# Patient Record
Sex: Male | Born: 1950 | Race: White | Hispanic: No | Marital: Married | Smoking: Never smoker
Health system: Southern US, Community
[De-identification: ages and names within clinical notes are randomized; demographics above are authoritative.]

## PROBLEM LIST (undated history)

## (undated) DIAGNOSIS — K439 Ventral hernia without obstruction or gangrene: Secondary | ICD-10-CM

## (undated) DIAGNOSIS — K649 Unspecified hemorrhoids: Secondary | ICD-10-CM

## (undated) DIAGNOSIS — R519 Headache, unspecified: Secondary | ICD-10-CM

## (undated) DIAGNOSIS — K5900 Constipation, unspecified: Secondary | ICD-10-CM

## (undated) DIAGNOSIS — K4090 Unilateral inguinal hernia, without obstruction or gangrene, not specified as recurrent: Secondary | ICD-10-CM

## (undated) HISTORY — PX: HERNIA REPAIR: SHX51

## (undated) HISTORY — PX: COLONOSCOPY: SHX174

---

## 2009-07-07 ENCOUNTER — Ambulatory Visit: Payer: Self-pay | Admitting: Gastroenterology

## 2011-11-08 ENCOUNTER — Emergency Department: Payer: Self-pay | Admitting: *Deleted

## 2011-11-08 LAB — BASIC METABOLIC PANEL
BUN: 13 mg/dL (ref 7–18)
EGFR (Non-African Amer.): >60
Glucose: 118 mg/dL — ABNORMAL HIGH (ref 65–99)
Osmolality: 282 (ref 275–301)
Potassium: 4 mmol/L (ref 3.5–5.1)

## 2011-11-08 LAB — CK TOTAL AND CKMB (NOT AT ARMC)
CK, Total: 97 U/L (ref 35–232)
CK, Total: 97 U/L (ref 35–232)

## 2011-11-08 LAB — CBC
HCT: 44.5 % (ref 40.0–52.0)
HGB: 15.7 g/dL (ref 13.0–18.0)
MCH: 33.7 pg (ref 26.0–34.0)
Platelet: 236 10*3/uL (ref 150–440)
RBC: 4.67 10*6/uL (ref 4.40–5.90)

## 2013-03-05 ENCOUNTER — Emergency Department (HOSPITAL_COMMUNITY)
Admission: EM | Admit: 2013-03-05 | Discharge: 2013-03-05 | Disposition: A | Discharge status: Home / Self Care | Payer: Worker's Compensation | Attending: Emergency Medicine | Admitting: Emergency Medicine

## 2013-03-05 ENCOUNTER — Encounter (HOSPITAL_COMMUNITY): Payer: Self-pay

## 2013-03-05 ENCOUNTER — Emergency Department (HOSPITAL_COMMUNITY): Payer: Worker's Compensation

## 2013-03-05 DIAGNOSIS — S0993XA Unspecified injury of face, initial encounter: Secondary | ICD-10-CM | POA: Insufficient documentation

## 2013-03-05 DIAGNOSIS — S5002XA Contusion of left elbow, initial encounter: Secondary | ICD-10-CM

## 2013-03-05 DIAGNOSIS — Y990 Civilian activity done for income or pay: Secondary | ICD-10-CM | POA: Insufficient documentation

## 2013-03-05 DIAGNOSIS — W010XXA Fall on same level from slipping, tripping and stumbling without subsequent striking against object, initial encounter: Secondary | ICD-10-CM | POA: Insufficient documentation

## 2013-03-05 DIAGNOSIS — S5000XA Contusion of unspecified elbow, initial encounter: Secondary | ICD-10-CM | POA: Insufficient documentation

## 2013-03-05 DIAGNOSIS — S6390XA Sprain of unspecified part of unspecified wrist and hand, initial encounter: Secondary | ICD-10-CM | POA: Insufficient documentation

## 2013-03-05 DIAGNOSIS — W19XXXA Unspecified fall, initial encounter: Secondary | ICD-10-CM

## 2013-03-05 DIAGNOSIS — M542 Cervicalgia: Secondary | ICD-10-CM

## 2013-03-05 DIAGNOSIS — Y9289 Other specified places as the place of occurrence of the external cause: Secondary | ICD-10-CM | POA: Insufficient documentation

## 2013-03-05 DIAGNOSIS — S63602A Unspecified sprain of left thumb, initial encounter: Secondary | ICD-10-CM

## 2013-03-05 DIAGNOSIS — Y9389 Activity, other specified: Secondary | ICD-10-CM | POA: Insufficient documentation

## 2013-03-05 MED ORDER — IBUPROFEN 800 MG PO TABS
800.0000 mg | ORAL_TABLET | Freq: Once | ORAL | Status: AC
  Administered 2013-03-05: 800 mg via ORAL
  Filled 2013-03-05: qty 1

## 2013-03-05 MED ORDER — METHOCARBAMOL 500 MG PO TABS: ORAL_TABLET | ORAL | Status: DC

## 2013-03-05 MED ORDER — NAPROXEN 500 MG PO TABS: 500.0000 mg | ORAL_TABLET | Freq: Two times a day (BID) | ORAL | Status: DC

## 2013-03-05 NOTE — ED Notes (Signed)
Pt alert & oriented x4, stable gait. Patient given discharge instructions, paperwork & prescription(s). Patient  instructed to stop at the registration desk to finish any additional paperwork. Patient verbalized understanding. Pt left department w/ no further questions. 

## 2013-03-05 NOTE — ED Provider Notes (Signed)
CSN: [id number]     Arrival date & time 03/05/13  1128 History   This chart was scribed for Ward Givens, MD by Quintella Reichert, ED scribe.  This patient was seen in room APA15/APA15 and the patient's care was started at 1:25 PM.  Chief Complaint  Patient presents with  . Fall    The history is provided by the patient. No language interpreter was used.    HPI Comments: Carl Gonzales is a 62 y.o. male with no chronic medical conditions who presents to the Emergency Department complaining of a fall that occurred 3 1/2 hours ago (10:15 this am).  Pt reports that he was at work, and a "line blew off" spilling oil on the floor and when he went to fix the line he slipped in oil and fell backward with impact to the left side of his back, left elbow, left hip and left side of his neck and head.  He was wearing a cap helmet that protected the back of his head.  He denies LOC.  Presently he complains of constant moderate pain to the  left wrist and thumb, left elbow, and the left side of his neck.  He also notes some intermittent "tingling" to the left side of his back.  He denies nausea, blurred vision, or numbness or tingling in fingers.  Pt denies regular medication usage.  He does not smoke or drink.  PCP is Dr. Robb Matar at [facility]   History reviewed. No pertinent past medical history.   History reviewed. No pertinent past surgical history.   No family history on file.   History  Substance Use Topics  . Smoking status: Never Smoker   . Smokeless tobacco: Not on file  . Alcohol Use: No   employed  Review of Systems  HENT: Positive for neck pain.   Eyes: Negative for visual disturbance.  Gastrointestinal: Negative for nausea.  Musculoskeletal: Positive for arthralgias.  Neurological: Negative for numbness.  All other systems reviewed and are negative.     Allergies  Review of patient's allergies indicates no known allergies.  Home Medications  No current outpatient prescriptions  on file.  BP 140/107  Pulse 85  Temp(Src) 98.3 F (36.8 C) (Oral)  Resp 20  Ht 5\' 7"  (1.702 m)  Wt 170 lb (77.111 kg)  BMI 26.62 kg/m2  SpO2 99%  Vital signs normal    Physical Exam  Nursing note and vitals reviewed. Constitutional: He is oriented to person, place, and time. He appears well-developed and well-nourished.  Non-toxic appearance. He does not appear ill. No distress.  HENT:  Head: Normocephalic and atraumatic.  Right Ear: External ear normal.  Left Ear: External ear normal.  Nose: Nose normal. No mucosal edema or rhinorrhea.  Mouth/Throat: Oropharynx is clear and moist and mucous membranes are normal. No dental abscesses or edematous.  Head nontender  Eyes: Conjunctivae and EOM are normal. Pupils are equal, round, and reactive to light.  Neck: Normal range of motion and full passive range of motion without pain. Neck supple.  Cardiovascular: Normal rate, regular rhythm and normal heart sounds.  Exam reveals no gallop and no friction rub.   No murmur heard. Pulmonary/Chest: Effort normal and breath sounds normal. No respiratory distress. He has no wheezes. He has no rhonchi. He has no rales. He exhibits no tenderness and no crepitus.  Abdominal: Soft. Normal appearance and bowel sounds are normal. He exhibits no distension. There is no tenderness. There is no rebound and no guarding.  Musculoskeletal: Normal range of motion. He exhibits no edema.       Left shoulder: He exhibits tenderness.       Left elbow: He exhibits no effusion.       Thoracic back: He exhibits no bony tenderness.       Lumbar back: He exhibits no bony tenderness.       Arms:      Hands: Nontender thoracic and lumbar spine Mild pain to the left shoulder on ROM.  Feels tightness posteriorly with ROM. No joint effusion to left elbow. Good ROM.  Pain in the radial aspect of left wrist on supination, no pain with dorsiflexion and extension Tender over the MCP of the left thumb without deformity,  abrasion.  Snuff box nontender  Neurological: He is alert and oriented to person, place, and time. He has normal strength. No cranial nerve deficit.  Skin: Skin is warm, dry and intact. Bruising noted. No rash noted. No erythema. No pallor.  Small dime-sized yellow bruise to left lateral chest  Psychiatric: He has a normal mood and affect. His speech is normal and behavior is normal. His mood appears not anxious.    ED Course  Procedures (including critical care time)  Medications  ibuprofen (ADVIL,MOTRIN) tablet 800 mg (800 mg Oral Given 03/05/13 1440)     DIAGNOSTIC STUDIES: Oxygen Saturation is 99% on room air, normal by my interpretation.    COORDINATION OF CARE: 1:32 PM: Discussed treatment plan which includes ibuprofen and imaging.  Pt expressed understanding and agreed to plan.  Pt given results of his xrays. He was placed in a velcro thumb spica splint.   MDM PT had mechanical fall from oil on the floor. Has pain in his left thumb with negative xrays, most likely has a sprain, but occult fracture possible. Placed in splint and ortho referral.    1. Fall, initial encounter   2. Contusion, elbow, left, initial encounter   3. Neck pain on left side   4. Sprain of thumb, left, initial encounter    New Prescriptions   METHOCARBAMOL (ROBAXIN) 500 MG TABLET    Take 1 or 2 po Q 6hrs for muscle soreness   NAPROXEN (NAPROSYN) 500 MG TABLET    Take 1 tablet (500 mg total) by mouth 2 (two) times daily.    Plan discharge   Devoria Albe, MD, FACEP   I personally performed the services described in this documentation, which was scribed in my presence. The recorded information has been reviewed and considered.  Devoria Albe, MD, Armando Gang    Ward Givens, MD 03/05/13 [phone]

## 2013-03-05 NOTE — ED Notes (Signed)
Pt escorted to lab for workers comp drug screen

## 2013-03-05 NOTE — ED Notes (Signed)
MD at bedside. 

## 2013-03-05 NOTE — ED Notes (Signed)
Pt reports fell in oil at work.  C/O pain to left side of neck, left side of back, and left hip.  Denies any LOC.  Reports hit his head but was wearing a bump cap.

## 2013-06-25 ENCOUNTER — Emergency Department: Payer: Self-pay | Admitting: Emergency Medicine

## 2013-06-25 LAB — BASIC METABOLIC PANEL
ANION GAP: 2 — AB (ref 7–16)
BUN: 11 mg/dL (ref 7–18)
CALCIUM: 9 mg/dL (ref 8.5–10.1)
Chloride: 105 mmol/L (ref 98–107)
Co2: 30 mmol/L (ref 21–32)
Creatinine: 0.87 mg/dL (ref 0.60–1.30)
EGFR (African American): >60
Glucose: 93 mg/dL (ref 65–99)
Osmolality: 273 (ref 275–301)
POTASSIUM: 3.8 mmol/L (ref 3.5–5.1)
SODIUM: 137 mmol/L (ref 136–145)

## 2013-06-25 LAB — TROPONIN I: Troponin-I: <0.02 ng/mL

## 2013-06-25 LAB — CBC
HCT: 45 % (ref 40.0–52.0)
HGB: 15.5 g/dL (ref 13.0–18.0)
MCH: 32.8 pg (ref 26.0–34.0)
MCHC: 34.3 g/dL (ref 32.0–36.0)
MCV: 96 fL (ref 80–100)
PLATELETS: 271 10*3/uL (ref 150–440)
RBC: 4.71 10*6/uL (ref 4.40–5.90)
RDW: 12.3 % (ref 11.5–14.5)
WBC: 6.4 10*3/uL (ref 3.8–10.6)

## 2013-06-25 LAB — PRO B NATRIURETIC PEPTIDE: B-Type Natriuretic Peptide: 29 pg/mL (ref 0–125)

## 2014-09-02 ENCOUNTER — Ambulatory Visit: Payer: Self-pay | Admitting: Gastroenterology

## 2019-04-19 ENCOUNTER — Other Ambulatory Visit: Payer: Self-pay

## 2019-04-19 DIAGNOSIS — Z20822 Contact with and (suspected) exposure to covid-19: Secondary | ICD-10-CM

## 2019-04-20 LAB — NOVEL CORONAVIRUS, NAA: SARS-CoV-2, NAA: NOT DETECTED

## 2020-01-10 ENCOUNTER — Encounter: Payer: Self-pay | Admitting: *Deleted

## 2020-01-10 ENCOUNTER — Other Ambulatory Visit
Admission: RE | Admit: 2020-01-10 | Discharge: 2020-01-10 | Disposition: A | Discharge status: Home / Self Care | Payer: Medicare Other | Source: Ambulatory Visit | Attending: Gastroenterology | Admitting: Gastroenterology

## 2020-01-10 ENCOUNTER — Other Ambulatory Visit: Payer: Self-pay

## 2020-01-10 DIAGNOSIS — Z01812 Encounter for preprocedural laboratory examination: Secondary | ICD-10-CM | POA: Diagnosis present

## 2020-01-10 DIAGNOSIS — Z20822 Contact with and (suspected) exposure to covid-19: Secondary | ICD-10-CM | POA: Insufficient documentation

## 2020-01-10 LAB — SARS CORONAVIRUS 2 (TAT 6-24 HRS): SARS Coronavirus 2: NEGATIVE

## 2020-01-11 ENCOUNTER — Encounter
Admission: RE | Disposition: A | Discharge status: Home / Self Care | Payer: Self-pay | Source: Home / Self Care | Attending: Gastroenterology

## 2020-01-11 ENCOUNTER — Other Ambulatory Visit: Payer: Self-pay

## 2020-01-11 ENCOUNTER — Ambulatory Visit: Payer: Medicare Other | Admitting: Certified Registered"

## 2020-01-11 ENCOUNTER — Encounter: Payer: Self-pay | Admitting: *Deleted

## 2020-01-11 ENCOUNTER — Ambulatory Visit
Admission: RE | Admit: 2020-01-11 | Discharge: 2020-01-11 | Disposition: A | Discharge status: Home / Self Care | Payer: Medicare Other | Attending: Gastroenterology | Admitting: Gastroenterology

## 2020-01-11 DIAGNOSIS — K648 Other hemorrhoids: Secondary | ICD-10-CM | POA: Insufficient documentation

## 2020-01-11 DIAGNOSIS — Z791 Long term (current) use of non-steroidal anti-inflammatories (NSAID): Secondary | ICD-10-CM | POA: Insufficient documentation

## 2020-01-11 DIAGNOSIS — Z1211 Encounter for screening for malignant neoplasm of colon: Secondary | ICD-10-CM | POA: Insufficient documentation

## 2020-01-11 DIAGNOSIS — Z8371 Family history of colonic polyps: Secondary | ICD-10-CM | POA: Insufficient documentation

## 2020-01-11 DIAGNOSIS — K5730 Diverticulosis of large intestine without perforation or abscess without bleeding: Secondary | ICD-10-CM | POA: Diagnosis not present

## 2020-01-11 HISTORY — DX: Constipation, unspecified: K59.00

## 2020-01-11 HISTORY — DX: Headache, unspecified: R51.9

## 2020-01-11 HISTORY — DX: Ventral hernia without obstruction or gangrene: K43.9

## 2020-01-11 HISTORY — PX: COLONOSCOPY WITH PROPOFOL: SHX5780

## 2020-01-11 HISTORY — DX: Unilateral inguinal hernia, without obstruction or gangrene, not specified as recurrent: K40.90

## 2020-01-11 HISTORY — DX: Unspecified hemorrhoids: K64.9

## 2020-01-11 SURGERY — COLONOSCOPY WITH PROPOFOL
Anesthesia: General

## 2020-01-11 MED ORDER — PROPOFOL 500 MG/50ML IV EMUL
INTRAVENOUS | Status: DC | PRN
  Administered 2020-01-11: 130 ug/kg/min via INTRAVENOUS

## 2020-01-11 MED ORDER — SODIUM CHLORIDE 0.9 % IV SOLN
INTRAVENOUS | Status: DC
  Administered 2020-01-11: 08:00:00 1000 mL via INTRAVENOUS

## 2020-01-11 MED ORDER — PROPOFOL 500 MG/50ML IV EMUL
INTRAVENOUS | Status: AC
  Filled 2020-01-11: qty 50

## 2020-01-11 NOTE — Anesthesia Postprocedure Evaluation (Signed)
Anesthesia Post Note  Patient: Carl Gonzales  Procedure(s) Performed: COLONOSCOPY WITH PROPOFOL (N/A )  Patient location during evaluation: PACU Anesthesia Type: General Level of consciousness: awake and alert Pain management: pain level controlled Vital Signs Assessment: post-procedure vital signs reviewed and stable Respiratory status: spontaneous breathing, nonlabored ventilation and respiratory function stable Cardiovascular status: blood pressure returned to baseline and stable Postop Assessment: no apparent nausea or vomiting Anesthetic complications: no   No complications documented.   Last Vitals:  Vitals:   01/11/20 0920 01/11/20 0921  BP: 137/87 133/87  Pulse: 67 70  Resp: 18 14  Temp:    SpO2: 100% 100%    Last Pain:  Vitals:   01/11/20 0900  TempSrc: Tympanic  PainSc:                  Karleen Hampshire

## 2020-01-11 NOTE — Interval H&P Note (Signed)
History and Physical Interval Note:  01/11/2020 8:12 AM  Carl Gonzales  has presented today for surgery, with the diagnosis of FAM HX COLON POLYPS.  The various methods of treatment have been discussed with the patient and family. After consideration of risks, benefits and other options for treatment, the patient has consented to  Procedure(s): COLONOSCOPY WITH PROPOFOL (N/A) as a surgical intervention.  The patient's history has been reviewed, patient examined, no change in status, stable for surgery.  I have reviewed the patient's chart and labs.  Questions were answered to the patient's satisfaction.     Regis Bill  Ok to proceed with colonoscopy

## 2020-01-11 NOTE — Op Note (Signed)
[facility] Patient Name: Carl Gonzales Procedure Date: 01/11/2020 8:33 AM MRN: [id number] Account #: [id number] Date of Birth: 22-Apr-1951 Admit Type: Outpatient Age: 69 Room: [facility] ENDO ROOM 3 Gender: Male Note Status: Finalized Procedure:             Colonoscopy Indications:           Colon cancer screening in patient at increased risk:                         Family history of 1st-degree relative with colon                         polyps before age 39 years Providers:             Andrey Farmer MD, MD Referring MD:          Youlanda Roys. Lovie Macadamia, MD (Referring MD) Medicines:             Monitored Anesthesia Care Complications:         No immediate complications. Procedure:             Pre-Anesthesia Assessment:                        - Prior to the procedure, a History and Physical was                         performed, and patient medications and allergies were                         reviewed. The patient is competent. The risks and                         benefits of the procedure and the sedation options and                         risks were discussed with the patient. All questions                         were answered and informed consent was obtained.                         Patient identification and proposed procedure were                         verified by the physician, the nurse, the anesthetist                         and the technician in the endoscopy suite. Mental                         Status Examination: alert and oriented. Airway                         Examination: normal oropharyngeal airway and neck                         mobility. Respiratory Examination: clear to  auscultation. CV Examination: normal. Prophylactic                         Antibiotics: The patient does not require prophylactic                         antibiotics. Prior Anticoagulants: The patient has                         taken no  previous anticoagulant or antiplatelet                         agents. ASA Grade Assessment: II - A patient with mild                         systemic disease. After reviewing the risks and                         benefits, the patient was deemed in satisfactory                         condition to undergo the procedure. The anesthesia                         plan was to use monitored anesthesia care (MAC).                         Immediately prior to administration of medications,                         the patient was re-assessed for adequacy to receive                         sedatives. The heart rate, respiratory rate, oxygen                         saturations, blood pressure, adequacy of pulmonary                         ventilation, and response to care were monitored                         throughout the procedure. The physical status of the                         patient was re-assessed after the procedure.                        After obtaining informed consent, the colonoscope was                         passed under direct vision. Throughout the procedure,                         the patient's blood pressure, pulse, and oxygen                         saturations were monitored continuously. The  Colonoscope was introduced through the anus and                         advanced to the the terminal ileum. The colonoscopy                         was performed without difficulty. The patient                         tolerated the procedure well. The quality of the bowel                         preparation was good. Findings:      The perianal and digital rectal examinations were normal.      The terminal ileum appeared normal.      A single small-mouthed diverticulum was found in the ascending colon.      Multiple small-mouthed diverticula were found in the sigmoid colon.      Non-bleeding internal hemorrhoids were found during retroflexion. The        hemorrhoids were small.      The exam was otherwise without abnormality on direct and retroflexion       views. Impression:            - The examined portion of the ileum was normal.                        - Diverticulosis in the ascending colon.                        - Diverticulosis in the sigmoid colon.                        - Non-bleeding internal hemorrhoids.                        - The examination was otherwise normal on direct and                         retroflexion views.                        - No specimens collected. Recommendation:        - Discharge patient to home.                        - Resume previous diet.                        - Continue present medications.                        - Repeat colonoscopy in 5 years for screening purposes.                        - Return to referring physician as previously                         scheduled. Procedure Code(s):     --- Professional ---  G0105, Colorectal cancer screening; colonoscopy on                         individual at high risk Diagnosis Code(s):     --- Professional ---                        Z83.71, Family history of colonic polyps                        K64.8, Other hemorrhoids                        K57.30, Diverticulosis of large intestine without                         perforation or abscess without bleeding CPT copyright 2019 [employer]. All rights reserved. The codes documented in this report are preliminary and upon coder review may  be revised to meet current compliance requirements. Andrey Farmer, MD Andrey Farmer MD, MD 01/11/2020 9:13:58 AM Number of Addenda: 0 Note Initiated On: 01/11/2020 8:33 AM Scope Withdrawal Time: 0 hours 10 minutes 59 seconds  Total Procedure Duration: 0 hours 18 minutes 18 seconds  Estimated Blood Loss:  Estimated blood loss: none.      [facility]

## 2020-01-11 NOTE — Transfer of Care (Signed)
Immediate Anesthesia Transfer of Care Note  Patient: Carl Gonzales  Procedure(s) Performed: COLONOSCOPY WITH PROPOFOL (N/A )  Patient Location: PACU and Endoscopy Unit  Anesthesia Type:General  Level of Consciousness: drowsy  Airway & Oxygen Therapy: Patient Spontanous Breathing  Post-op Assessment: Report given to RN  Post vital signs: stable  Last Vitals:  Vitals Value Taken Time  BP    Temp    Pulse    Resp    SpO2      Last Pain:  Vitals:   01/11/20 0803  TempSrc: Tympanic  PainSc: 0-No pain         Complications: No complications documented.

## 2020-01-11 NOTE — Anesthesia Preprocedure Evaluation (Signed)
Anesthesia Evaluation  Patient identified by MRN, date of birth, ID band Patient awake    Reviewed: Allergy & Precautions, H&P , NPO status , Patient's Chart, lab work & pertinent test results  Airway Mallampati: II  TM Distance: >3 FB Neck ROM: full    Dental  (+) Partial Upper   Pulmonary neg pulmonary ROS, neg COPD, Not current smoker,    Pulmonary exam normal        Cardiovascular (-) angina(-) Past MI and (-) Cardiac Stents negative cardio ROS Normal cardiovascular exam(-) dysrhythmias      Neuro/Psych  Headaches, negative psych ROS   GI/Hepatic negative GI ROS, Neg liver ROS,   Endo/Other  negative endocrine ROS  Renal/GU negative Renal ROS  negative genitourinary   Musculoskeletal   Abdominal   Peds  Hematology negative hematology ROS (+)   Anesthesia Other Findings Past Medical History: No date: Constipation No date: Headache No date: Hemorrhoids No date: Hernia of abdominal wall No date: Hernia, inguinal  Past Surgical History: No date: COLONOSCOPY No date: HERNIA REPAIR  BMI    Body Mass Index: 26.63 kg/m      Reproductive/Obstetrics negative OB ROS                             Anesthesia Physical Anesthesia Plan  ASA: II  Anesthesia Plan: General   Post-op Pain Management:    Induction:   PONV Risk Score and Plan: Propofol infusion and TIVA  Airway Management Planned: Nasal Cannula  Additional Equipment:   Intra-op Plan:   Post-operative Plan:   Informed Consent: I have reviewed the patients History and Physical, chart, labs and discussed the procedure including the risks, benefits and alternatives for the proposed anesthesia with the patient or authorized representative who has indicated his/her understanding and acceptance.     Dental Advisory Given  Plan Discussed with: Anesthesiologist, CRNA and Surgeon  Anesthesia Plan Comments:          Anesthesia Quick Evaluation

## 2020-01-11 NOTE — H&P (Signed)
Outpatient short stay form Pre-procedure 01/11/2020 8:09 AM Merlyn Lot MD, MPH  Primary Physician: Dr. Terance Hart  Reason for visit:  Family history of polyps  History of present illness:   69 y/o gentleman with family history of what sounds like an advanced adenoma in his daughter in her 68's here for screening colonoscopy. No blood thinners. No polyps on previous endoscopies. History of bilateral hernia repair in 80's.   Current Facility-Administered Medications:  .  0.9 %  sodium chloride infusion, , Intravenous, Continuous, Atari Novick, Rossie Muskrat, MD  Medications Prior to Admission  Medication Sig Dispense Refill Last Dose  . methocarbamol (ROBAXIN) 500 MG tablet Take 1 or 2 po Q 6hrs for muscle soreness 60 tablet 0   . naproxen (NAPROSYN) 500 MG tablet Take 1 tablet (500 mg total) by mouth 2 (two) times daily. 30 tablet 0      No Known Allergies   Past Medical History:  Diagnosis Date  . Constipation   . Headache   . Hemorrhoids   . Hernia of abdominal wall   . Hernia, inguinal     Review of systems:  Otherwise negative.    Physical Exam  Gen: Alert, oriented. Appears stated age.  HEENT:  PERRLA. Lungs: no respiratory distress Abd: soft, benign, no masses Ext: No edema    Planned procedures: Proceed with colonoscopy. The patient understands the nature of the planned procedure, indications, risks, alternatives and potential complications including but not limited to bleeding, infection, perforation, damage to internal organs and possible oversedation/side effects from anesthesia. The patient agrees and gives consent to proceed.  Please refer to procedure notes for findings, recommendations and patient disposition/instructions.     Merlyn Lot MD, MPH Gastroenterology 01/11/2020  8:09 AM

## 2020-01-12 ENCOUNTER — Encounter: Payer: Self-pay | Admitting: Gastroenterology

## 2020-06-09 ENCOUNTER — Other Ambulatory Visit: Payer: Self-pay | Admitting: Specialist

## 2020-06-09 ENCOUNTER — Other Ambulatory Visit (HOSPITAL_COMMUNITY): Payer: Self-pay | Admitting: Specialist

## 2020-06-09 DIAGNOSIS — R0609 Other forms of dyspnea: Secondary | ICD-10-CM

## 2020-06-27 ENCOUNTER — Other Ambulatory Visit: Payer: Self-pay

## 2020-06-27 ENCOUNTER — Ambulatory Visit
Admission: RE | Admit: 2020-06-27 | Discharge: 2020-06-27 | Disposition: A | Discharge status: Home / Self Care | Payer: Medicare Other | Source: Ambulatory Visit | Attending: Specialist | Admitting: Specialist

## 2020-06-27 DIAGNOSIS — R0600 Dyspnea, unspecified: Secondary | ICD-10-CM | POA: Insufficient documentation

## 2020-06-27 DIAGNOSIS — R0609 Other forms of dyspnea: Secondary | ICD-10-CM

## 2021-10-09 ENCOUNTER — Other Ambulatory Visit: Payer: Self-pay | Admitting: Physician Assistant

## 2021-10-09 DIAGNOSIS — R2689 Other abnormalities of gait and mobility: Secondary | ICD-10-CM

## 2021-10-09 DIAGNOSIS — R413 Other amnesia: Secondary | ICD-10-CM

## 2021-10-19 ENCOUNTER — Ambulatory Visit
Admission: RE | Admit: 2021-10-19 | Discharge: 2021-10-19 | Disposition: A | Discharge status: Home / Self Care | Payer: Medicare Other | Source: Ambulatory Visit | Attending: Physician Assistant | Admitting: Physician Assistant

## 2021-10-19 DIAGNOSIS — R413 Other amnesia: Secondary | ICD-10-CM

## 2021-10-19 DIAGNOSIS — R2689 Other abnormalities of gait and mobility: Secondary | ICD-10-CM | POA: Insufficient documentation

## 2022-11-22 IMAGING — MR MR HEAD W/O CM
12 series · 47 of 48 positions shown · non-contrast
Comparison: None.

CLINICAL DATA: Mild memory loss

EXAM:
MRI HEAD WITHOUT CONTRAST
TECHNIQUE: Multiplanar, multiecho pulse sequences of the brain and surrounding
structures were obtained without intravenous contrast.

[Series 5: ax dwi_tracew · axial · 3.0mm · 0.65mm/px · z∈[-117,+34]mm · 7 of 96 slices shown]
[im 1/96]
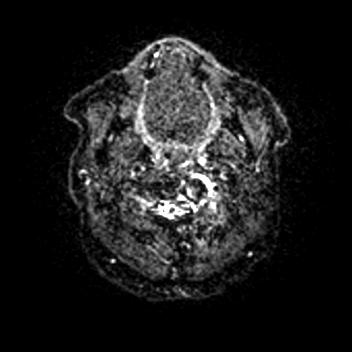
[im 16/96]
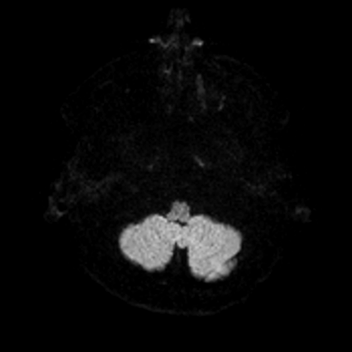
[im 32/96]
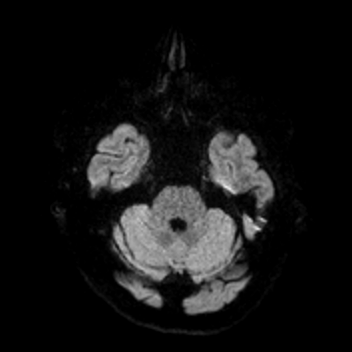
[im 48/96]
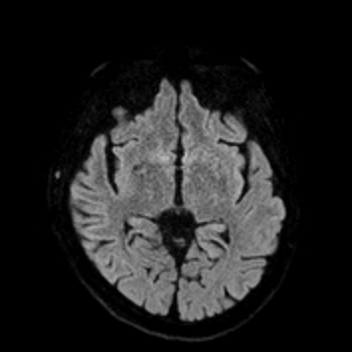
[im 64/96]
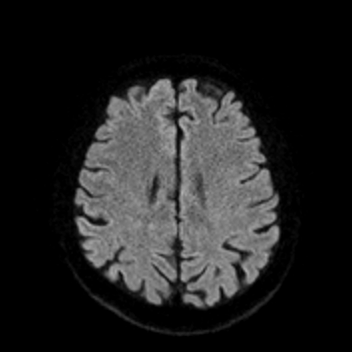
[im 80/96]
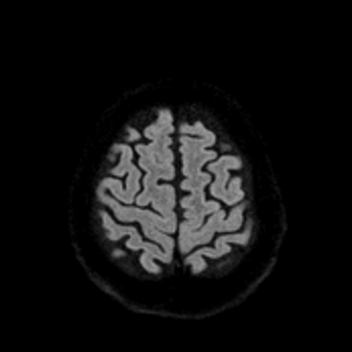
[im 96/96]
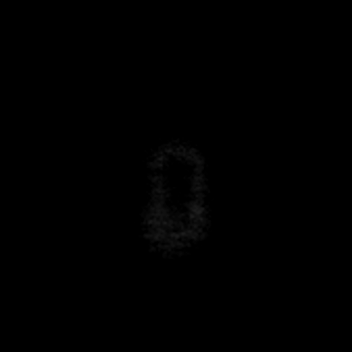

[Series 6: ax dwi_adc · axial · 3.0mm · 0.65mm/px · z∈[-117,+34]mm · 3 of 48 slices shown]
[im 1/48]
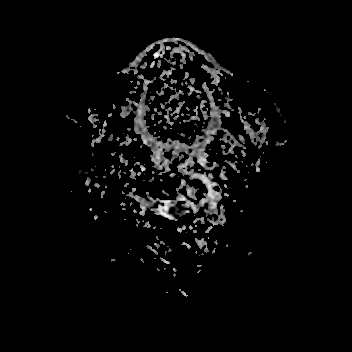
[im 24/48]
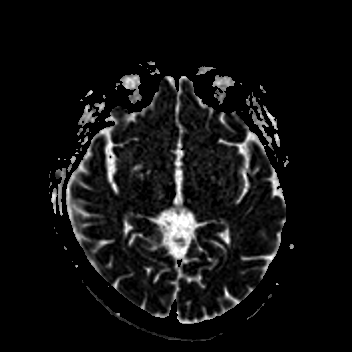
[im 48/48]
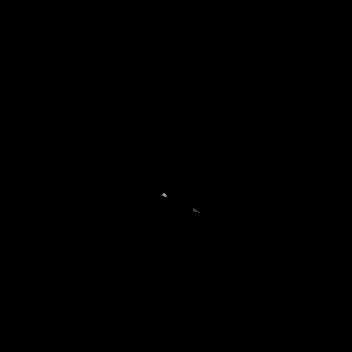

[Series 7: cor dwi_tracew · coronal · 5.0mm · 0.60mm/px · 2 of 38 slices shown]
[im 1/38]
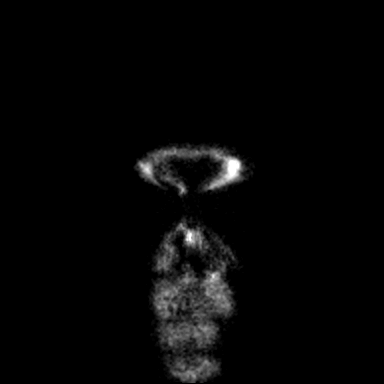
[im 38/38]
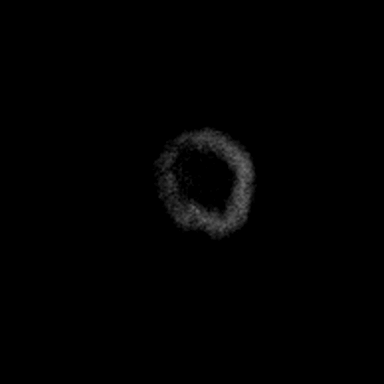

[Series 8: cor dwi_adc · coronal · 5.0mm · 0.60mm/px · 2 of 38 slices shown]
[im 1/38]
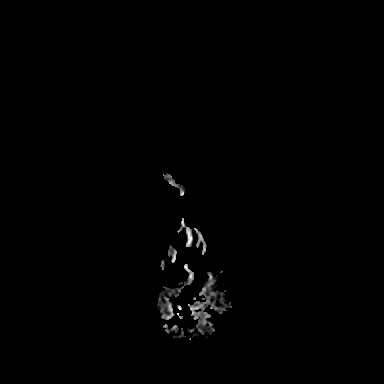
[im 38/38]
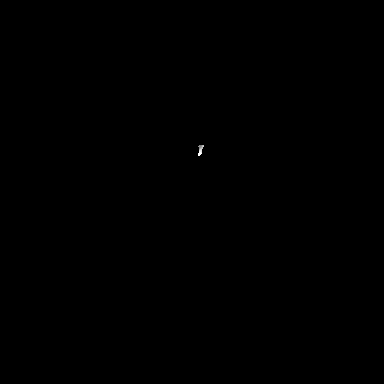

[Series 9: T1 · sagittal · 5.0mm · 0.62mm/px · 1 of 23 slices shown (1 of 2)]
[im 1/23]
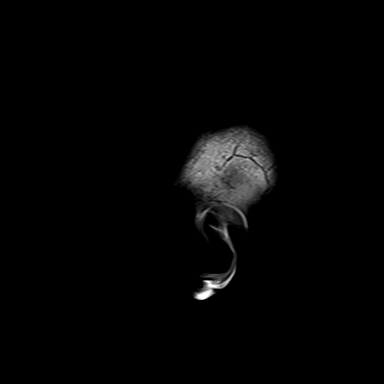

[Series 10: T2 · axial · 5.0mm · 0.53mm/px · z∈[-111,+30]mm · 2 of 25 slices shown (1 of 2)]
[im 1/25]
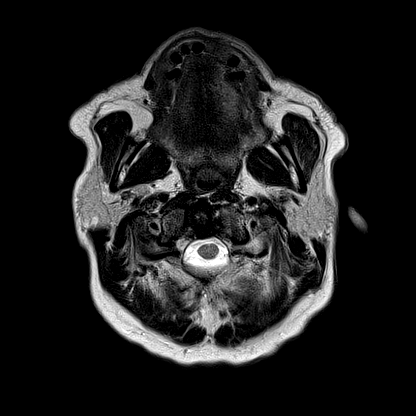
[im 25/25]
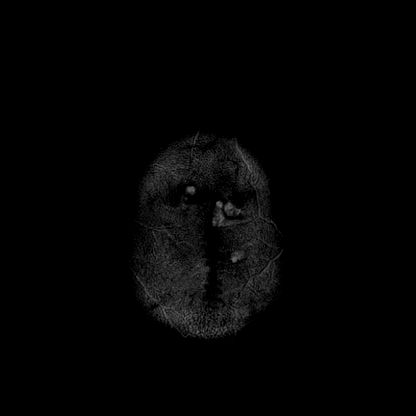

[Series 11: ax swi_mag · axial · 2.0mm · 0.90mm/px · z∈[-119,+35]mm · 5 of 80 slices shown]
[im 1/80]
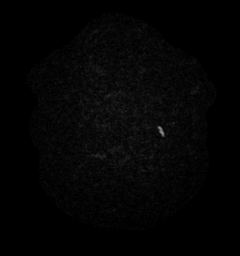
[im 20/80]
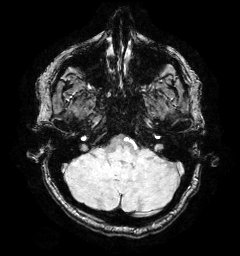
[im 40/80]
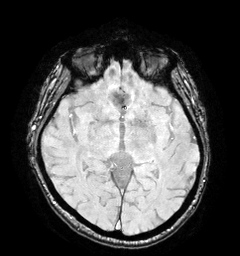
[im 60/80]
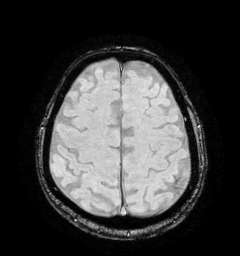
[im 80/80]
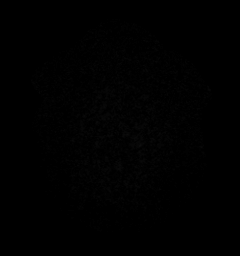

[Series 12: ax swi_pha · axial · 2.0mm · 0.90mm/px · z∈[-119,+35]mm · 5 of 80 slices shown]
[im 1/80]
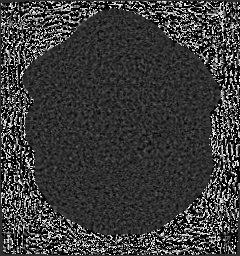
[im 20/80]
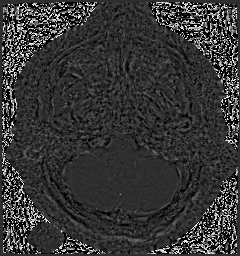
[im 40/80]
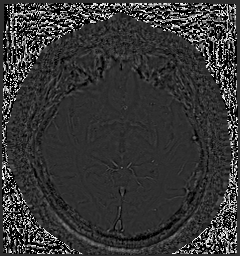
[im 60/80]
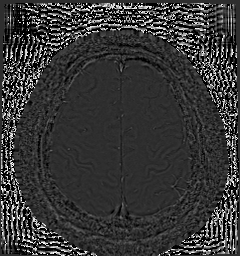
[im 80/80]
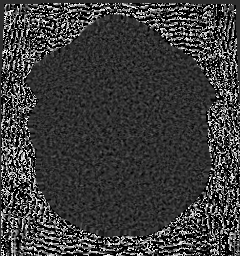

[Series 13: ax swi_swi · axial · 2.0mm · 0.90mm/px · z∈[-119,+35]mm · 5 of 80 slices shown]
[im 1/80]
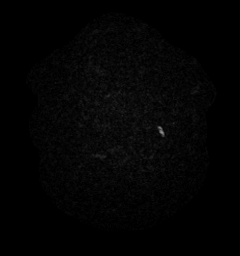
[im 20/80]
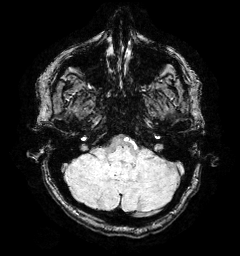
[im 40/80]
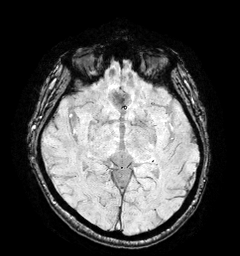
[im 60/80]
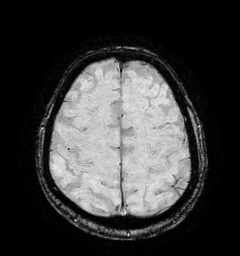
[im 80/80]
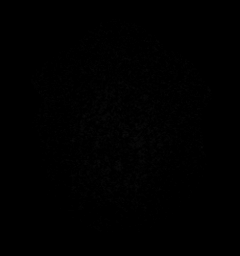

[Series 15: FLAIR · axial · 3.0mm · 0.53mm/px · z∈[-120,+38]mm · 4 of 55 slices shown]
[im 1/55]
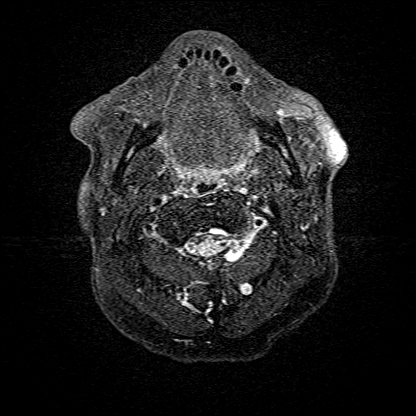
[im 19/55]
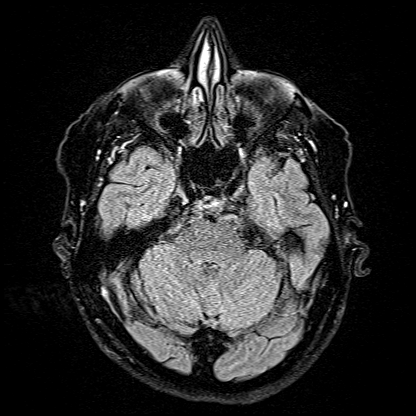
[im 37/55]
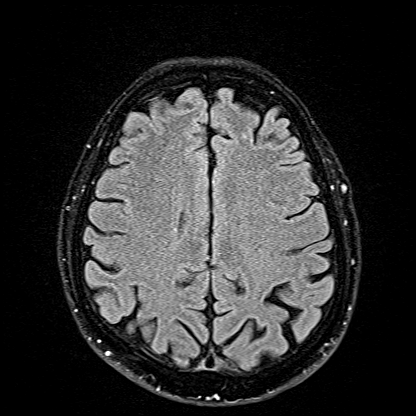
[im 55/55]
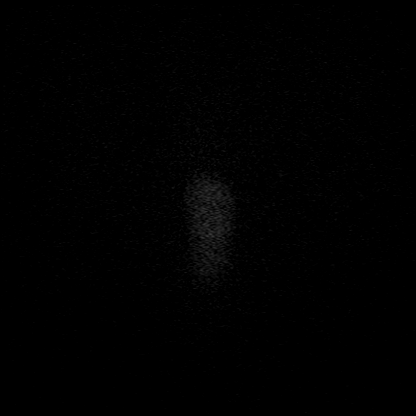

[Series 16: T1 · axial · 1.0mm · 0.98mm/px · z∈[-121,+34]mm · 9 of 160 slices shown (2 of 2)]
[im 1/160]
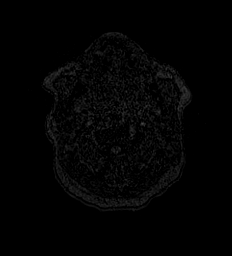
[im 18/160]
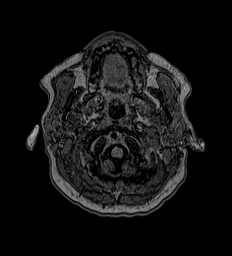
[im 36/160]
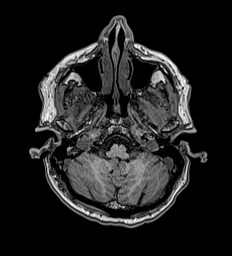
[im 54/160]
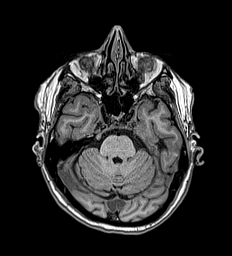
[im 71/160]
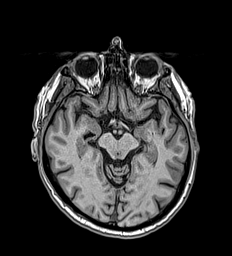
[im 89/160]
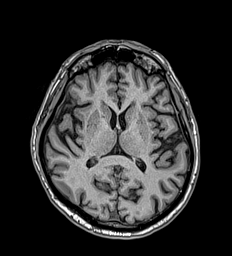
[im 107/160]
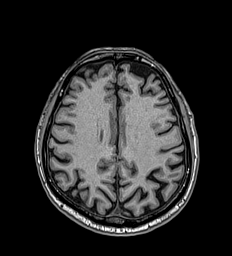
[im 142/160]
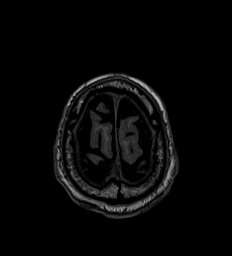
[im 160/160]
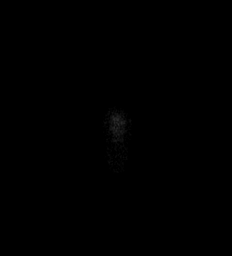

[Series 17: T2 · coronal · 5.0mm · 0.57mm/px · 2 of 29 slices shown (2 of 2)]
[im 1/29]
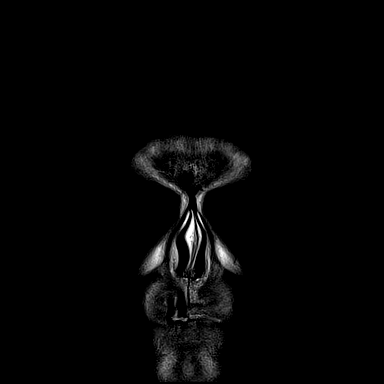
[im 29/29]
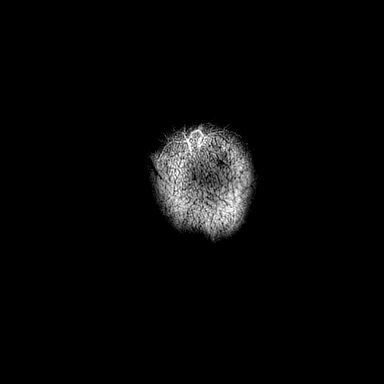

[47 of 48 positions shown; findings below may reference images not displayed]

FINDINGS: Brain: No restricted diffusion to suggest acute or subacute infarct.
No acute hemorrhage, mass, mass effect, or midline shift. No
hydrocephalus or extra-axial collection. No hemosiderin deposition
to suggest remote hemorrhage or superficial siderosis. Cerebral
volume is normal for age. Minimal T2 hyperintense signal in the
periventricular white matter, likely the sequela of minimal chronic
small vessel ischemic disease.

Vascular: Normal flow voids.

Skull and upper cervical spine: Normal marrow signal.

Sinuses/Orbits: Mild mucosal thickening in the left maxillary sinus.
Otherwise clear paranasal sinuses. The orbits are unremarkable.

Other: The mastoids are well aerated.
IMPRESSION: 1.  No acute intracranial process.

2. Normal cerebral volume for age. No etiology is seen for the
patient's reported memory loss

## 2024-04-05 NOTE — Progress Notes (Signed)
 Established Patient Visit   Chief Complaint: Chief Complaint  Patient presents with  . Chest Pain    Occas   . Shortness of Breath    Occas patient stated when he works   . Foot Swelling    Patient stated in his left; patient stated he puts a bandage on it and it goes away   . Dizziness    Rarely; when moving too quickly    Date of Service: 04/12/2024 Date of Birth: 07-06-1950 PCP: Glover Alm Hail, MD  History of Present Illness: Mr. Carl Gonzales is a 73 y.o.male patient who presents for a 1 year follow up. PMH significant for chest pain, GERD, pulmonary emphysema, neuropathy, edema.  Today, pt presents with no new cardiac concerns. Patient states that he is doing well. Denies having any recent chest pain. Encouraged to regularly exercise. Order CTA FFR for further assessment of SOB.    Visit Summaries: 04/10/2023 Patient was seen by me for a follow up. Recommend lasix.  Past Medical and Surgical History  Past Medical History Past Medical History:  Diagnosis Date  . Aching headache   . Diverticulosis 09/02/2014  . Hernia of abdominal wall 1981  . Inguinal hernia    bilateral  . Internal hemorrhoids 09/02/2014    Past Surgical History He has a past surgical history that includes BILATERAL INGUINAL HERNIA REPAIR (1980); Colonoscopy (07/07/2009); Colonoscopy (09/02/2014); Hernia repair (1981); and Colonoscopy (01/11/2020).   Medications and Allergies  Current Medications  Current Outpatient Medications  Medication Sig Dispense Refill  . albuterol 90 mcg/actuation inhaler Inhale 2 inhalations into the lungs every 6 (six) hours as needed for Wheezing 1 g 6  . cyanocobalamin (VITAMIN B12) 1000 MCG tablet Take 1,000 mcg by mouth once daily    . esomeprazole (NEXIUM) 20 MG DR capsule Take 20 mg by mouth once daily    . fluticasone propionate (FLONASE) 50 mcg/actuation nasal spray Place 2 sprays into both nostrils once daily    . L.acid/B.animalis,bifidum/FOS (PROBIOTIC COMPLEX  ORAL) Take 1 capsule by mouth once daily    . methylcellulose (CITRUCEL) 500 mg tablet Take 500 mg by mouth once daily as needed for Constipation    . FUROsemide (LASIX) 20 MG tablet TAKE 1 TABLET BY MOUTH EVERY DAY (Patient not taking: Reported on 04/12/2024) 90 tablet 1  . metoprolol TARTrate (LOPRESSOR) 25 MG tablet Take 1 dose (25 mg total) by mouth two hours prior to cardiac CT scan 1 tablet 0   No current facility-administered medications for this visit.    Allergies: Patient has no known allergies.  Social and Family History  Social History  reports that he has never smoked. He has never used smokeless tobacco. He reports that he does not currently use alcohol. He reports that he does not use drugs.  Family History Family History  Problem Relation Name Age of Onset  . Lung cancer Mother    . Heart disease Father    . Myocardial Infarction (Heart attack) Father    . Scleroderma Father    . Valvular heart disease Sister    . Alcohol abuse Sister    . Alcohol abuse Brother      Review of Systems   Pertinent positives and negatives are mentioned above in HPI and all other systems are negative.  Physical Examination   Vitals:BP 130/70 (BP Location: Left upper arm, Patient Position: Sitting, BP Cuff Size: Adult)   Pulse 77   Resp 13   Ht 167.6 cm (5' 6)  Wt 80.9 kg (178 lb 6.4 oz)   SpO2 97%   BMI 28.79 kg/m  Ht:167.6 cm (5' 6) Wt:80.9 kg (178 lb 6.4 oz) ADJ:Anib surface area is 1.94 meters squared. Body mass index is 28.79 kg/m.  HEENT: Pupils equally reactive to light and accomodation  Neck: Supple without thyromegaly, carotid pulses 2+ Lungs: clear to auscultation bilaterally; no wheezes, rales, rhonchi Heart: Regular rate and rhythm.  No gallops, murmurs or rub Abdomen: soft nontender, nondistended, with normal bowel sounds Extremities: no cyanosis, clubbing, or edema Peripheral Pulses: 2+ in all extremities, 2+ femoral pulses bilaterally Neurologic: Alert  and oriented X3; speech intact; face symmetrical; moves all extremities well  Cardiovascular Studies:    Echocardiogram 2D complete: 08/25/2020 INTERPRETATION NORMAL LEFT VENTRICULAR SYSTOLIC FUNCTION NORMAL RIGHT VENTRICULAR SYSTOLIC FUNCTION TRIVIAL REGURGITATION NOTED (See above) NO VALVULAR STENOSIS TRIVIAL AR, MR, TR EF 50%  NM Myocardial Perfusion SPECT multiple (stress and rest): 08/25/2020 FINDINGS: Regional wall motion:  demonstrates  Borderline hypokinesis of the globally. The overall quality of the study is fair.   Artifacts noted: no Left ventricular cavity: normal.   Perfusion Analysis:  SPECT images demonstrate homogeneous tracer distribution throughout the myocardium. Defect type:  Normal   IMPRESSION: Borderline myocardial perfusion scan perfusion no defects no evidence of ischemia borderline LV function ejection fraction between 45 and 50% globally.  This is a low intermediate scan recommend conservative therapy at this point    Cardiac Catheterization:   Holter:  Cardiac CT Scan:  Cardiac MRI:   Assessment   73 y.o. male with  1. SOB (shortness of breath)   2. Elevated blood pressure reading   3. Pulmonary emphysema (CMS/HHS-HCC)   4. Edema of both legs   5. Gastroesophageal reflux disease, unspecified whether esophagitis present    Plan   SOB, order CTA FFR for further assessment  Pulmonary emphysema, continue inhaler use, follow up with pulmonary as needed Edema, recommend support stockings, BLE elevation, and reducing sodium intake GERD, consider omeprazole/protonix therapy for reflux type symptoms   Return in about 3 months (around 07/13/2024).  This note is partially written by Leita Ellen, in the presence of and acting as the scribe of Dr. Cara Lovelace.      Leita Ellen  I have reviewed, edited and added to the note to reflect my best personal medical judgment.  Attestation Statement:   I personally performed the service.  (TP)  DWAYNE JONETTA LOVELACE, MD  [facility] [place], [place] Ph:  [phone] Fax:  [phone] This note was generated in part with voice recognition software, Dragon.  I apologize for any typographical errors that were not detected and corrected from this process.  They are unintentional.

## 2024-04-13 ENCOUNTER — Other Ambulatory Visit: Payer: Self-pay | Admitting: Internal Medicine

## 2024-04-13 ENCOUNTER — Encounter: Payer: Self-pay | Admitting: Internal Medicine

## 2024-04-13 DIAGNOSIS — R0609 Other forms of dyspnea: Secondary | ICD-10-CM

## 2024-04-13 DIAGNOSIS — I10 Essential (primary) hypertension: Secondary | ICD-10-CM

## 2024-04-13 DIAGNOSIS — R0602 Shortness of breath: Secondary | ICD-10-CM

## 2024-04-13 DIAGNOSIS — R600 Localized edema: Secondary | ICD-10-CM

## 2024-04-13 DIAGNOSIS — G629 Polyneuropathy, unspecified: Secondary | ICD-10-CM

## 2024-04-13 DIAGNOSIS — J439 Emphysema, unspecified: Secondary | ICD-10-CM

## 2024-04-13 DIAGNOSIS — I200 Unstable angina: Secondary | ICD-10-CM

## 2024-04-13 DIAGNOSIS — I2089 Other forms of angina pectoris: Secondary | ICD-10-CM

## 2024-04-13 DIAGNOSIS — R42 Dizziness and giddiness: Secondary | ICD-10-CM

## 2024-04-13 DIAGNOSIS — R079 Chest pain, unspecified: Secondary | ICD-10-CM

## 2024-04-13 DIAGNOSIS — R519 Headache, unspecified: Secondary | ICD-10-CM

## 2024-04-29 ENCOUNTER — Encounter (HOSPITAL_COMMUNITY): Payer: Self-pay

## 2024-05-03 ENCOUNTER — Ambulatory Visit
Admission: RE | Admit: 2024-05-03 | Discharge: 2024-05-03 | Disposition: A | Discharge status: Home / Self Care | Source: Ambulatory Visit | Attending: Internal Medicine | Admitting: Internal Medicine

## 2024-05-03 DIAGNOSIS — R519 Headache, unspecified: Secondary | ICD-10-CM | POA: Diagnosis present

## 2024-05-03 DIAGNOSIS — G629 Polyneuropathy, unspecified: Secondary | ICD-10-CM | POA: Insufficient documentation

## 2024-05-03 DIAGNOSIS — I200 Unstable angina: Secondary | ICD-10-CM | POA: Diagnosis present

## 2024-05-03 DIAGNOSIS — I10 Essential (primary) hypertension: Secondary | ICD-10-CM | POA: Diagnosis present

## 2024-05-03 DIAGNOSIS — J439 Emphysema, unspecified: Secondary | ICD-10-CM | POA: Diagnosis present

## 2024-05-03 DIAGNOSIS — R079 Chest pain, unspecified: Secondary | ICD-10-CM | POA: Insufficient documentation

## 2024-05-03 DIAGNOSIS — R600 Localized edema: Secondary | ICD-10-CM | POA: Diagnosis present

## 2024-05-03 DIAGNOSIS — R0609 Other forms of dyspnea: Secondary | ICD-10-CM | POA: Insufficient documentation

## 2024-05-03 DIAGNOSIS — R0602 Shortness of breath: Secondary | ICD-10-CM | POA: Insufficient documentation

## 2024-05-03 DIAGNOSIS — R42 Dizziness and giddiness: Secondary | ICD-10-CM | POA: Diagnosis present

## 2024-05-03 MED ORDER — NITROGLYCERIN 0.4 MG SL SUBL
0.8000 mg | SUBLINGUAL_TABLET | Freq: Once | SUBLINGUAL | Status: AC
  Administered 2024-05-03: 0.8 mg via SUBLINGUAL
  Filled 2024-05-03: qty 25

## 2024-05-03 MED ORDER — IOHEXOL 350 MG/ML SOLN
100.0000 mL | Freq: Once | INTRAVENOUS | Status: AC | PRN
  Administered 2024-05-03: 100 mL via INTRAVENOUS

## 2024-05-05 ENCOUNTER — Ambulatory Visit
Admission: RE | Admit: 2024-05-05 | Discharge: 2024-05-05 | Disposition: A | Discharge status: Home / Self Care | Payer: Medicare (Managed Care) | Source: Ambulatory Visit | Attending: Cardiology | Admitting: Cardiology

## 2024-05-05 ENCOUNTER — Other Ambulatory Visit: Payer: Self-pay | Admitting: Cardiology

## 2024-05-05 DIAGNOSIS — I25118 Atherosclerotic heart disease of native coronary artery with other forms of angina pectoris: Secondary | ICD-10-CM

## 2024-05-05 DIAGNOSIS — R931 Abnormal findings on diagnostic imaging of heart and coronary circulation: Secondary | ICD-10-CM | POA: Diagnosis not present

## 2024-06-02 ENCOUNTER — Encounter: Admission: RE | Disposition: A | Payer: Self-pay | Attending: Internal Medicine

## 2024-06-02 ENCOUNTER — Encounter: Payer: Self-pay | Admitting: Internal Medicine

## 2024-06-02 ENCOUNTER — Ambulatory Visit
Admission: RE | Admit: 2024-06-02 | Discharge: 2024-06-02 | Disposition: A | Attending: Internal Medicine | Admitting: Internal Medicine

## 2024-06-02 ENCOUNTER — Other Ambulatory Visit: Payer: Self-pay

## 2024-06-02 DIAGNOSIS — R931 Abnormal findings on diagnostic imaging of heart and coronary circulation: Secondary | ICD-10-CM

## 2024-06-02 HISTORY — PX: LEFT HEART CATH AND CORONARY ANGIOGRAPHY: CATH118249

## 2024-06-02 LAB — CARDIAC CATHETERIZATION: Cath EF Quantitative: 60 %

## 2024-06-02 SURGERY — LEFT HEART CATH AND CORONARY ANGIOGRAPHY
Anesthesia: Moderate Sedation | Laterality: Left

## 2024-06-02 MED ORDER — ONDANSETRON HCL 4 MG/2ML IJ SOLN: 4.0000 mg | Freq: Four times a day (QID) | INTRAMUSCULAR | Status: DC | PRN

## 2024-06-02 MED ORDER — ACETAMINOPHEN 325 MG PO TABS: 650.0000 mg | ORAL_TABLET | ORAL | Status: DC | PRN

## 2024-06-02 MED ORDER — SODIUM CHLORIDE 0.9% FLUSH: 3.0000 mL | INTRAVENOUS | Status: DC | PRN

## 2024-06-02 MED ORDER — SODIUM CHLORIDE 0.9% FLUSH: 3.0000 mL | Freq: Two times a day (BID) | INTRAVENOUS | Status: DC

## 2024-06-02 MED ORDER — FREE WATER
500.0000 mL | Freq: Once | Status: AC
  Administered 2024-06-02: 500 mL via ORAL

## 2024-06-02 MED ORDER — MIDAZOLAM HCL (PF) 2 MG/2ML IJ SOLN
INTRAMUSCULAR | Status: DC | PRN
  Administered 2024-06-02: 1 mg via INTRAVENOUS

## 2024-06-02 MED ORDER — LIDOCAINE HCL (PF) 1 % IJ SOLN
INTRAMUSCULAR | Status: DC | PRN
  Administered 2024-06-02: 2 mL

## 2024-06-02 MED ORDER — ROSUVASTATIN CALCIUM 40 MG PO TABS: 40.0000 mg | ORAL_TABLET | Freq: Every day | ORAL | 0 refills | Status: AC

## 2024-06-02 MED ORDER — HEPARIN (PORCINE) IN NACL 1000-0.9 UT/500ML-% IV SOLN
INTRAVENOUS | Status: DC | PRN
  Administered 2024-06-02: 1000 mL

## 2024-06-02 MED ORDER — HEPARIN SODIUM (PORCINE) 1000 UNIT/ML IJ SOLN
INTRAMUSCULAR | Status: DC | PRN
  Administered 2024-06-02: 3500 [IU] via INTRAVENOUS

## 2024-06-02 MED ORDER — HEPARIN SODIUM (PORCINE) 1000 UNIT/ML IJ SOLN
INTRAMUSCULAR | Status: AC
  Filled 2024-06-02: qty 10

## 2024-06-02 MED ORDER — FENTANYL CITRATE (PF) 100 MCG/2ML IJ SOLN
INTRAMUSCULAR | Status: AC
  Filled 2024-06-02: qty 2

## 2024-06-02 MED ORDER — HEPARIN (PORCINE) IN NACL 1000-0.9 UT/500ML-% IV SOLN
INTRAVENOUS | Status: AC
  Filled 2024-06-02: qty 1000

## 2024-06-02 MED ORDER — IOHEXOL 300 MG/ML  SOLN
INTRAMUSCULAR | Status: DC | PRN
  Administered 2024-06-02: 72 mL

## 2024-06-02 MED ORDER — ASPIRIN 81 MG PO CHEW
81.0000 mg | CHEWABLE_TABLET | ORAL | Status: AC
  Administered 2024-06-02: 81 mg via ORAL

## 2024-06-02 MED ORDER — SODIUM CHLORIDE 0.9 % IV SOLN: 250.0000 mL | INTRAVENOUS | Status: DC | PRN

## 2024-06-02 MED ORDER — FENTANYL CITRATE (PF) 100 MCG/2ML IJ SOLN
INTRAMUSCULAR | Status: DC | PRN
  Administered 2024-06-02: 50 ug via INTRAVENOUS

## 2024-06-02 MED ORDER — HYDRALAZINE HCL 20 MG/ML IJ SOLN: 10.0000 mg | INTRAMUSCULAR | Status: DC | PRN

## 2024-06-02 MED ORDER — MIDAZOLAM HCL 2 MG/2ML IJ SOLN
INTRAMUSCULAR | Status: AC
  Filled 2024-06-02: qty 2

## 2024-06-02 MED ORDER — VERAPAMIL HCL 2.5 MG/ML IV SOLN
INTRAVENOUS | Status: AC
  Filled 2024-06-02: qty 2

## 2024-06-02 MED ORDER — VERAPAMIL HCL 2.5 MG/ML IV SOLN
INTRAVENOUS | Status: DC | PRN
  Administered 2024-06-02: 2.5 mg via INTRA_ARTERIAL

## 2024-06-02 MED ORDER — LIDOCAINE HCL 1 % IJ SOLN
INTRAMUSCULAR | Status: AC
  Filled 2024-06-02: qty 20

## 2024-06-02 MED ORDER — ASPIRIN 81 MG PO CHEW
CHEWABLE_TABLET | ORAL | Status: AC
  Filled 2024-06-02: qty 1

## 2024-06-02 MED ORDER — METOPROLOL SUCCINATE ER 25 MG PO TB24: 25.0000 mg | ORAL_TABLET | Freq: Every day | ORAL | 0 refills | Status: AC

## 2024-06-02 SURGICAL SUPPLY — 8 items
CATH 5FR JL3.5 JR4 ANG PIG MP (CATHETERS) IMPLANT
DEVICE RAD TR BAND REGULAR (VASCULAR PRODUCTS) IMPLANT
DRAPE BRACHIAL (DRAPES) IMPLANT
GLIDESHEATH SLEND SS 6F .021 (SHEATH) IMPLANT
GUIDEWIRE INQWIRE 1.5J.035X260 (WIRE) IMPLANT
PACK CARDIAC CATH (CUSTOM PROCEDURE TRAY) ×1 IMPLANT
SET ATX-X65L (MISCELLANEOUS) IMPLANT
STATION PROTECTION PRESSURIZED (MISCELLANEOUS) IMPLANT

## 2024-06-02 NOTE — Discharge Instructions (Signed)
 Radial Site Care Refer to this sheet in the next few weeks. These instructions provide you with information about caring for yourself after your procedure. Your health care provider may also give you more specific instructions. Your treatment has been planned according to current medical practices, but problems sometimes occur. Call your health care provider if you have any problems or questions after your procedure. What can I expect after the procedure? After your procedure, it is typical to have the following: Bruising at the radial site that usually fades within 1-2 weeks. Blood collecting in the tissue (hematoma) that may be painful to the touch. It should usually decrease in size and tenderness within 1-2 weeks.  Follow these instructions at home: Take medicines only as directed by your health care provider. If you are on a medication called Metformin please do not take for 48 hours after your procedure. Over the next 48hrs please increase your fluid intake of water and non caffeine beverages to flush the contrast dye out of your system.  You may shower 24 hours after the procedure  Leave your bandage on and gently wash the site with plain soap and water. Pat the area dry with a clean towel. Do not rub the site, because this may cause bleeding.  Remove your dressing 48hrs after your procedure and leave open to air.  Do not submerge your site in water for 7 days. This includes swimming and washing dishes.  Check your insertion site every day for redness, swelling, or drainage. Do not apply powder or lotion to the site. Do not flex or bend the affected arm for 24 hours or as directed by your health care provider. Do not push or pull heavy objects with the affected arm for 24 hours or as directed by your health care provider. Do not lift over 10 lb (4.5 kg) for 5 days after your procedure or as directed by your health care provider. Ask your health care provider when it is okay to: Return to  work or school. Resume usual physical activities or sports. Resume sexual activity. Do not drive home if you are discharged the same day as the procedure. Have someone else drive you. You may drive 48 hours after the procedure Do not operate machinery or power tools for 24 hours after the procedure. If your procedure was done as an outpatient procedure, which means that you went home the same day as your procedure, a responsible adult should be with you for the first 24 hours after you arrive home. Keep all follow-up visits as directed by your health care provider. This is important. Contact a health care provider if: You have a fever. You have chills. You have increased bleeding from the radial site. Hold pressure on the site. Get help right away if: You have unusual pain at the radial site. You have redness, warmth, or swelling at the radial site. You have drainage (other than a small amount of blood on the dressing) from the radial site. The radial site is bleeding, and the bleeding does not stop after 15 minutes of holding steady pressure on the site. Your arm or hand becomes pale, cool, tingly, or numb. This information is not intended to replace advice given to you by your health care provider. Make sure you discuss any questions you have with your health care provider. Document Released: 07/13/2010 Document Revised: 11/16/2015 Document Reviewed: 12/27/2013 Elsevier Interactive Patient Education  2018 [employer].
